# Patient Record
Sex: Female | Born: 1990 | Race: White | Hispanic: No | Marital: Married | Smoking: Never smoker
Health system: Southern US, Community
[De-identification: ages and names within clinical notes are randomized; demographics above are authoritative.]

## PROBLEM LIST (undated history)

## (undated) DIAGNOSIS — N200 Calculus of kidney: Secondary | ICD-10-CM

## (undated) HISTORY — PX: CHOLECYSTECTOMY: SHX55

---

## 2013-01-07 ENCOUNTER — Ambulatory Visit: Payer: Self-pay | Admitting: Family Medicine

## 2014-09-03 ENCOUNTER — Ambulatory Visit: Payer: Self-pay | Admitting: Family Medicine

## 2014-09-03 LAB — RAPID INFLUENZA A&B ANTIGENS

## 2014-09-03 LAB — RAPID STREP-A WITH REFLX: MICRO TEXT REPORT: NEGATIVE

## 2014-09-07 LAB — BETA STREP CULTURE(ARMC)

## 2014-12-26 ENCOUNTER — Ambulatory Visit: Payer: Self-pay | Admitting: Physician Assistant

## 2014-12-26 LAB — RAPID STREP-A WITH REFLX: MICRO TEXT REPORT: NEGATIVE

## 2014-12-28 LAB — BETA STREP CULTURE(ARMC)

## 2014-12-31 ENCOUNTER — Ambulatory Visit: Payer: Self-pay | Admitting: Physician Assistant

## 2014-12-31 LAB — CBC WITH DIFFERENTIAL/PLATELET
BASOS ABS: 0.1 10*3/uL (ref 0.0–0.1)
BASOS PCT: 0.5 %
EOS ABS: 0.1 10*3/uL (ref 0.0–0.7)
Eosinophil %: 0.7 %
HCT: 41 % (ref 35.0–47.0)
HGB: 13.8 g/dL (ref 12.0–16.0)
LYMPHS PCT: 29.7 %
Lymphocyte #: 3.9 10*3/uL — ABNORMAL HIGH (ref 1.0–3.6)
MCH: 28.1 pg (ref 26.0–34.0)
MCHC: 33.7 g/dL (ref 32.0–36.0)
MCV: 83 fL (ref 80–100)
Monocyte #: 0.5 x10 3/mm (ref 0.2–0.9)
Monocyte %: 3.6 %
NEUTROS PCT: 65.5 %
Neutrophil #: 8.6 10*3/uL — ABNORMAL HIGH (ref 1.4–6.5)
Platelet: 314 10*3/uL (ref 150–440)
RBC: 4.92 10*6/uL (ref 3.80–5.20)
RDW: 12.9 % (ref 11.5–14.5)
WBC: 13.2 10*3/uL — ABNORMAL HIGH (ref 3.6–11.0)

## 2014-12-31 LAB — URINALYSIS, COMPLETE
Bilirubin,UR: NEGATIVE
Blood: NEGATIVE
Glucose,UR: NEGATIVE
Ketone: 40
Leukocyte Esterase: NEGATIVE
NITRITE: NEGATIVE
PROTEIN: NEGATIVE
Ph: 5.5 (ref 5.0–8.0)
Specific Gravity: 1.025 (ref 1.000–1.030)

## 2014-12-31 LAB — MONONUCLEOSIS SCREEN: Mono Test: NEGATIVE

## 2015-01-01 ENCOUNTER — Emergency Department: Payer: Self-pay | Admitting: Emergency Medicine

## 2015-01-01 LAB — CBC
HCT: 41.8 % (ref 35.0–47.0)
HGB: 13.7 g/dL (ref 12.0–16.0)
MCH: 28.1 pg (ref 26.0–34.0)
MCHC: 32.9 g/dL (ref 32.0–36.0)
MCV: 85 fL (ref 80–100)
Platelet: 299 10*3/uL (ref 150–440)
RBC: 4.89 10*6/uL (ref 3.80–5.20)
RDW: 13.1 % (ref 11.5–14.5)
WBC: 9.8 10*3/uL (ref 3.6–11.0)

## 2015-01-01 LAB — COMPREHENSIVE METABOLIC PANEL
ALT: 49 U/L (ref 14–63)
ANION GAP: 8 (ref 7–16)
AST: 40 U/L — AB (ref 15–37)
Albumin: 4 g/dL (ref 3.4–5.0)
Alkaline Phosphatase: 77 U/L (ref 46–116)
BUN: 9 mg/dL (ref 7–18)
Bilirubin,Total: 0.4 mg/dL (ref 0.2–1.0)
CALCIUM: 9.7 mg/dL (ref 8.5–10.1)
CO2: 27 mmol/L (ref 21–32)
Chloride: 104 mmol/L (ref 98–107)
Creatinine: 0.87 mg/dL (ref 0.60–1.30)
EGFR (Non-African Amer.): >60
Glucose: 95 mg/dL (ref 65–99)
Osmolality: 276 (ref 275–301)
POTASSIUM: 3.9 mmol/L (ref 3.5–5.1)
Sodium: 139 mmol/L (ref 136–145)
Total Protein: 8.3 g/dL — ABNORMAL HIGH (ref 6.4–8.2)

## 2015-01-01 LAB — URINALYSIS, COMPLETE
BILIRUBIN, UR: NEGATIVE
Blood: NEGATIVE
Glucose,UR: NEGATIVE mg/dL (ref 0–75)
KETONE: NEGATIVE
LEUKOCYTE ESTERASE: NEGATIVE
NITRITE: NEGATIVE
PH: 7 (ref 4.5–8.0)
PROTEIN: NEGATIVE
RBC,UR: <1 /HPF (ref 0–5)
SPECIFIC GRAVITY: 1.009 (ref 1.003–1.030)
Squamous Epithelial: 1
WBC UR: 1 /HPF (ref 0–5)

## 2015-01-01 LAB — LIPASE, BLOOD: Lipase: 115 U/L (ref 73–393)

## 2015-01-01 LAB — HCG, QUANTITATIVE, PREGNANCY

## 2015-01-02 LAB — URINE CULTURE

## 2015-01-03 ENCOUNTER — Ambulatory Visit: Payer: Self-pay | Admitting: Family Medicine

## 2015-01-15 ENCOUNTER — Ambulatory Visit: Payer: Self-pay | Admitting: Family Medicine

## 2016-01-06 ENCOUNTER — Ambulatory Visit
Admission: EM | Admit: 2016-01-06 | Discharge: 2016-01-06 | Disposition: A | Discharge status: Home / Self Care | Payer: 59 | Attending: Family Medicine | Admitting: Family Medicine

## 2016-01-06 DIAGNOSIS — J019 Acute sinusitis, unspecified: Secondary | ICD-10-CM | POA: Diagnosis not present

## 2016-01-06 DIAGNOSIS — J069 Acute upper respiratory infection, unspecified: Secondary | ICD-10-CM | POA: Diagnosis not present

## 2016-01-06 HISTORY — DX: Calculus of kidney: N20.0

## 2016-01-06 MED ORDER — FEXOFENADINE-PSEUDOEPHED ER 180-240 MG PO TB24: 1.0000 | ORAL_TABLET | Freq: Every day | ORAL | Status: DC

## 2016-01-06 MED ORDER — PREDNISONE 10 MG (21) PO TBPK: ORAL_TABLET | ORAL | Status: DC

## 2016-01-06 MED ORDER — CEFUROXIME AXETIL 500 MG PO TABS: 500.0000 mg | ORAL_TABLET | Freq: Two times a day (BID) | ORAL | Status: DC

## 2016-01-06 NOTE — ED Notes (Signed)
Patient states that she has been experiencing heavy nasal congestion, body aches, headache, and occ. Cough which started this past Wednesday.

## 2016-01-06 NOTE — ED Provider Notes (Signed)
CSN: [id number]     Arrival date & time 01/06/16  4098 History   First MD Initiated Contact with Patient 01/06/16 1026    Nurses notes were reviewed. Chief Complaint  Patient presents with  . URI   Patient's here some nasal congestion cough. She reports facial pain pressure and myalgia she's had a mild sore throat from drainage and she's had a cough is nonproductive, nose is green take them occasionally some dark black specks seen most nose. States that it started last week Wednesday. No fever and myalgias continued as well. The nasal congestion seems to be getting worse She does not smoke. History of kidney stones the gallbladder surgery and her father has diabetes and hypertension.    (Consider location/radiation/quality/duration/timing/severity/associated sxs/prior Treatment) Patient is a 25 y.o. female presenting with URI. The history is provided by the patient. No language interpreter was used.  URI Presenting symptoms: congestion, cough, facial pain, rhinorrhea and sore throat   Presenting symptoms: no ear pain and no fever   Severity:  Moderate Duration:  1 week Progression:  Worsening Chronicity:  New Relieved by:  Nothing Associated symptoms: myalgias, sinus pain and swollen glands   Risk factors: recent illness   Risk factors: no chronic kidney disease, no chronic respiratory disease and no diabetes mellitus     Past Medical History  Diagnosis Date  . Kidney stones    Past Surgical History  Procedure Laterality Date  . Cholecystectomy     Family History  Problem Relation Age of Onset  . Diabetes Father   . Hypertension Father    Social History  Substance Use Topics  . Smoking status: Never Smoker   . Smokeless tobacco: None  . Alcohol Use: Yes     Comment: Socially   OB History    No data available     Review of Systems  Constitutional: Negative for fever.  HENT: Positive for congestion, rhinorrhea and sore throat. Negative for ear pain.   Respiratory:  Positive for cough.   Musculoskeletal: Positive for myalgias.  All other systems reviewed and are negative.   Allergies  Amoxicillin  Home Medications   Prior to Admission medications   Medication Sig Start Date End Date Taking? Authorizing Provider  norgestimate-ethinyl estradiol (ORTHO-CYCLEN,SPRINTEC,PREVIFEM) 0.25-35 MG-MCG tablet Take 1 tablet by mouth daily.   Yes Historical Provider, MD  cefUROXime (CEFTIN) 500 MG tablet Take 1 tablet (500 mg total) by mouth 2 (two) times daily. 01/06/16   Hassan Rowan, MD  fexofenadine-pseudoephedrine (ALLEGRA-D ALLERGY & CONGESTION) 180-240 MG 24 hr tablet Take 1 tablet by mouth daily. 01/06/16   Hassan Rowan, MD  predniSONE (STERAPRED UNI-PAK 21 TAB) 10 MG (21) TBPK tablet Sig 6 tablet day 1, 5 tablets day 2, 4 tablets day 3,,3tablets day 4, 2 tablets day 5, 1 tablet day 6 take all tablets orally 01/06/16   Hassan Rowan, MD   Meds Ordered and Administered this Visit  Medications - No data to display  BP 115/74 mmHg  Pulse 78  Temp(Src) 98.6 F (37 C) (Oral)  Ht  (1.6 m)  Wt 255 lb (115.667 kg)  BMI 45.18 kg/m2  SpO2 100%  LMP 12/19/2015 No data found.   Physical Exam  Constitutional: She is oriented to person, place, and time. She appears well-developed and well-nourished.  HENT:  Head: Normocephalic and atraumatic.  Right Ear: Hearing, tympanic membrane, external ear and ear canal normal.  Left Ear: Hearing, tympanic membrane and external ear normal.  Nose: Mucosal edema and  rhinorrhea present. Right sinus exhibits maxillary sinus tenderness and frontal sinus tenderness. Left sinus exhibits maxillary sinus tenderness and frontal sinus tenderness.  Mouth/Throat: She does not have dentures. Normal dentition. Posterior oropharyngeal erythema present.  Eyes: Conjunctivae are normal. Pupils are equal, round, and reactive to light.  Neck: Normal range of motion. Neck supple.  Cardiovascular: Normal rate and regular rhythm.    Pulmonary/Chest: Effort normal.  Musculoskeletal: Normal range of motion.  Lymphadenopathy:    She has cervical adenopathy.  Neurological: She is alert and oriented to person, place, and time.  Skin: Skin is warm.  Psychiatric: She has a normal mood and affect.  Vitals reviewed.   ED Course  Procedures (including critical care time)  Labs Review Labs Reviewed - No data to display  Imaging Review No results found.   Visual Acuity Review  Right Eye Distance:   Left Eye Distance:   Bilateral Distance:    Right Eye Near:   Left Eye Near:    Bilateral Near:         MDM   1. Acute sinusitis, recurrence not specified, unspecified location   2. URI, acute      We'll treat her for sinusitis. Allegra-D 1 tablet daily, Ceftin 500 mg 1 tablet twice a day, will place on a six-day course of prednisone taper dose.. Work note for today and tomorrow also be given and follow-up with PCP in 1-2 weeks.  Note: This dictation was prepared with Dragon dictation along with smaller phrase technology. Any transcriptional errors that result from this process are unintentional.     Hassan Rowan, MD 01/06/16 1116

## 2016-01-06 NOTE — Discharge Instructions (Signed)
Sinusitis, Adult °Sinusitis is redness, soreness, and puffiness (inflammation) of the air pockets in the bones of your face (sinuses). The redness, soreness, and puffiness can cause air and mucus to get trapped in your sinuses. This can allow germs to grow and cause an infection.  °HOME CARE  °· Drink enough fluids to keep your pee (urine) clear or pale yellow. °· Use a humidifier in your home. °· Run a hot shower to create steam in the bathroom. Sit in the bathroom with the door closed. Breathe in the steam 3-4 times a day. °· Put a warm, moist washcloth on your face 3-4 times a day, or as told by your doctor. °· Use salt water sprays (saline sprays) to wet the thick fluid in your nose. This can help the sinuses drain. °· Only take medicine as told by your doctor. °GET HELP RIGHT AWAY IF:  °· Your pain gets worse. °· You have very bad headaches. °· You are sick to your stomach (nauseous). °· You throw up (vomit). °· You are very sleepy (drowsy) all the time. °· Your face is puffy (swollen). °· Your vision changes. °· You have a stiff neck. °· You have trouble breathing. °MAKE SURE YOU:  °· Understand these instructions. °· Will watch your condition. °· Will get help right away if you are not doing well or get worse. °  °This information is not intended to replace advice given to you by your health care provider. Make sure you discuss any questions you have with your health care provider. °  °Document Released: 05/03/2008 Document Revised: 12/06/2014 Document Reviewed: 06/20/2012 °Elsevier Interactive Patient Education ©2016 [employer] ° °Upper Respiratory Infection, Adult °Most upper respiratory infections (URIs) are caused by a virus. A URI affects the nose, throat, and upper air passages. The most common type of URI is often called "the common cold." °HOME CARE  °· Take medicines only as told by your doctor. °· Gargle warm saltwater or take cough drops to comfort your throat as told by your doctor. °· Use a  warm mist humidifier or inhale steam from a shower to increase air moisture. This may make it easier to breathe. °· Drink enough fluid to keep your pee (urine) clear or pale yellow. °· Eat soups and other clear broths. °· Have a healthy diet. °· Rest as needed. °· Go back to work when your fever is gone or your doctor says it is okay. °¨ You may need to stay home longer to avoid giving your URI to others. °¨ You can also wear a face mask and wash your hands often to prevent spread of the virus. °· Use your inhaler more if you have asthma. °· Do not use any tobacco products, including cigarettes, chewing tobacco, or electronic cigarettes. If you need help quitting, ask your doctor. °GET HELP IF: °· You are getting worse, not better. °· Your symptoms are not helped by medicine. °· You have chills. °· You are getting more short of breath. °· You have brown or red mucus. °· You have yellow or brown discharge from your nose. °· You have pain in your face, especially when you bend forward. °· You have a fever. °· You have puffy (swollen) neck glands. °· You have pain while swallowing. °· You have white areas in the back of your throat. °GET HELP RIGHT AWAY IF:  °· You have very bad or constant: °¨ Headache. °¨ Ear pain. °¨ Pain in your forehead, behind your eyes, and over   your cheekbones (sinus pain). °¨ Chest pain. °· You have long-lasting (chronic) lung disease and any of the following: °¨ Wheezing. °¨ Long-lasting cough. °¨ Coughing up blood. °¨ A change in your usual mucus. °· You have a stiff neck. °· You have changes in your: °¨ Vision. °¨ Hearing. °¨ Thinking. °¨ Mood. °MAKE SURE YOU:  °· Understand these instructions. °· Will watch your condition. °· Will get help right away if you are not doing well or get worse. °  °This information is not intended to replace advice given to you by your health care provider. Make sure you discuss any questions you have with your health care provider. °  °Document Released:  05/03/2008 Document Revised: 04/01/2015 Document Reviewed: 02/20/2014 °Elsevier Interactive Patient Education ©2016 Elsevier Inc. ° °

## 2016-09-28 ENCOUNTER — Ambulatory Visit
Admission: EM | Admit: 2016-09-28 | Discharge: 2016-09-28 | Disposition: A | Discharge status: Home / Self Care | Payer: 59 | Attending: Family Medicine | Admitting: Family Medicine

## 2016-09-28 DIAGNOSIS — J209 Acute bronchitis, unspecified: Secondary | ICD-10-CM | POA: Diagnosis not present

## 2016-09-28 DIAGNOSIS — R059 Cough, unspecified: Secondary | ICD-10-CM

## 2016-09-28 DIAGNOSIS — J069 Acute upper respiratory infection, unspecified: Secondary | ICD-10-CM | POA: Diagnosis not present

## 2016-09-28 DIAGNOSIS — R05 Cough: Secondary | ICD-10-CM | POA: Diagnosis not present

## 2016-09-28 MED ORDER — AZITHROMYCIN 250 MG PO TABS: ORAL_TABLET | ORAL | 0 refills | Status: DC

## 2016-09-28 MED ORDER — HYDROCOD POLST-CPM POLST ER 10-8 MG/5ML PO SUER: 5.0000 mL | Freq: Two times a day (BID) | ORAL | 0 refills | Status: DC | PRN

## 2016-09-28 MED ORDER — FEXOFENADINE-PSEUDOEPHED ER 180-240 MG PO TB24: 1.0000 | ORAL_TABLET | Freq: Every day | ORAL | 0 refills | Status: DC

## 2016-09-28 NOTE — ED Provider Notes (Addendum)
[facility]    CSN: [id number] Arrival date & time: 09/28/16  1052     History   Chief Complaint Chief Complaint  Patient presents with  . Cough    HPI Kathryn Valdez is a 25 y.o. female.   She is here because of three-week cough. She states she will cough for about 3 weeks but the last 4 days she's had increased cough and productive sputum. She denies any wheezing she does not smoke no known drug allergies. She denies any significant past family medical history. Surgery is cholecystectomy in the recent past. She goes [employer].   The history is provided by the patient. No language interpreter was used.  URI  Presenting symptoms: cough, rhinorrhea and sore throat   Severity:  Mild Progression:  Waxing and waning Chronicity:  New Relieved by:  Nothing Worsened by:  Nothing Ineffective treatments:  None tried   Past Medical History:  Diagnosis Date  . Kidney stones     There are no active problems to display for this patient.   Past Surgical History:  Procedure Laterality Date  . CHOLECYSTECTOMY      OB History    No data available       Home Medications    Prior to Admission medications   Medication Sig Start Date End Date Taking? Authorizing Provider  fexofenadine-pseudoephedrine (ALLEGRA-D ALLERGY & CONGESTION) 180-240 MG 24 hr tablet Take 1 tablet by mouth daily. 01/06/16  Yes Hassan RowanEugene Duell Holdren, MD  norgestimate-ethinyl estradiol (ORTHO-CYCLEN,SPRINTEC,PREVIFEM) 0.25-35 MG-MCG tablet Take 1 tablet by mouth daily.   Yes Historical Provider, MD  azithromycin (ZITHROMAX Z-PAK) 250 MG tablet Take 2 tablets first day and then 1 po a day for 4 days 09/28/16   Hassan RowanEugene Branston Halsted, MD  chlorpheniramine-HYDROcodone [facility](TUSSIONEX PENNKINETIC ER) 10-8 MG/5ML SUER Take 5 mLs by mouth every 12 (twelve) hours as needed for cough. 09/28/16   Hassan RowanEugene Kristyne Woodring, MD  fexofenadine-pseudoephedrine (ALLEGRA-D ALLERGY & CONGESTION) 180-240 MG 24 hr tablet Take 1 tablet  by mouth daily. 09/28/16   Hassan RowanEugene Takiah Maiden, MD    Family History Family History  Problem Relation Age of Onset  . Diabetes Father   . Hypertension Father     Social History Social History  Substance Use Topics  . Smoking status: Never Smoker  . Smokeless tobacco: Never Used  . Alcohol use Yes     Comment: Socially     Allergies   Amoxicillin   Review of Systems Review of Systems  HENT: Positive for rhinorrhea and sore throat.   Respiratory: Positive for cough and shortness of breath.   All other systems reviewed and are negative.    Physical Exam Triage Vital Signs ED Triage Vitals  Enc Vitals Group     BP 09/28/16 1305 (!) 127/94     Pulse Rate 09/28/16 1305 87     Resp 09/28/16 1305 18     Temp 09/28/16 1305 98.2 F (36.8 C)     Temp Source 09/28/16 1305 Oral     SpO2 09/28/16 1305 100 %     Weight 09/28/16 1305 250 lb (113.4 kg)     Height 09/28/16 1305 5\' 3"  (1.6 m)     Head Circumference --      Peak Flow --      Pain Score 09/28/16 1307 4     Pain Loc --      Pain Edu? --      Excl. in GC? --    No data  found.   Updated Vital Signs BP (!) 127/94 (BP Location: Left Arm)   Pulse 87   Temp 98.2 F (36.8 C) (Oral)   Resp 18   Ht 5\' 3"  (1.6 m)   Wt 250 lb (113.4 kg)   LMP 09/07/2016   SpO2 100%   BMI 44.29 kg/m   Visual Acuity Right Eye Distance:   Left Eye Distance:   Bilateral Distance:    Right Eye Near:   Left Eye Near:    Bilateral Near:     Physical Exam  Constitutional: She is oriented to person, place, and time. She appears well-developed and well-nourished.  HENT:  Head: Normocephalic and atraumatic.  Right Ear: Hearing, tympanic membrane, external ear and ear canal normal.  Left Ear: Hearing, tympanic membrane, external ear and ear canal normal.  Nose: Nose normal. No mucosal edema or rhinorrhea. Right sinus exhibits no maxillary sinus tenderness and no frontal sinus tenderness. Left sinus exhibits no maxillary sinus tenderness  and no frontal sinus tenderness.  Mouth/Throat: Uvula is midline, oropharynx is clear and moist and mucous membranes are normal. No posterior oropharyngeal erythema.  Eyes: Pupils are equal, round, and reactive to light.  Neck: Normal range of motion. Neck supple.  Cardiovascular: Normal rate and regular rhythm.   Pulmonary/Chest: Effort normal and breath sounds normal.  Musculoskeletal: Normal range of motion.  Lymphadenopathy:    She has cervical adenopathy.  Neurological: She is alert and oriented to person, place, and time.  Skin: Skin is warm.  Psychiatric: She has a normal mood and affect.  Vitals reviewed.    UC Treatments / Results  Labs (all labs ordered are listed, but only abnormal results are displayed) Labs Reviewed - No data to display  EKG  EKG Interpretation None       Radiology No results found.  Procedures Procedures (including critical care time)  Medications Ordered in UC Medications - No data to display   Initial Impression / Assessment and Plan / UC Course  I have reviewed the triage vital signs and the nursing notes.  Pertinent labs & imaging results that were available during my care of the patient were reviewed by me and considered in my medical decision making (see chart for details).  Clinical Course  We'll treat for bronchitis with Allegra-D. Tussionex 1 teaspoon once twice a day warned about driving with the Tussionex and a Z-Pak. for school for today and tomorrow as well. pcp in 2 weeks not better  Final Clinical Impressions(s) / UC Diagnoses   Final diagnoses:  Cough  Upper respiratory tract infection, unspecified type  Acute bronchitis, unspecified organism    New Prescriptions New Prescriptions   AZITHROMYCIN (ZITHROMAX Z-PAK) 250 MG TABLET    Take 2 tablets first day and then 1 po a day for 4 days   CHLORPHENIRAMINE-HYDROCODONE (TUSSIONEX PENNKINETIC ER) 10-8 MG/5ML SUER    Take 5 mLs by mouth every 12 (twelve) hours as needed  for cough.   FEXOFENADINE-PSEUDOEPHEDRINE (ALLEGRA-D ALLERGY & CONGESTION) 180-240 MG 24 HR TABLET    Take 1 tablet by mouth daily.     Note: This dictation was prepared with Dragon dictation along with smaller phrase technology. Any transcriptional errors that result from this process are unintentional.   Hassan RowanEugene Annalysse Shoemaker, MD 09/28/16 1342    Hassan RowanEugene Leyanna Bittman, MD 09/28/16 2043

## 2016-09-28 NOTE — ED Triage Notes (Signed)
Pt c/o cough that she cant get rid of. She had a cold in early October and the cough will not go away and its painful in her chest when she coughs it feels tight.

## 2016-11-11 ENCOUNTER — Ambulatory Visit (INDEPENDENT_AMBULATORY_CARE_PROVIDER_SITE_OTHER): Payer: 59

## 2016-11-11 ENCOUNTER — Ambulatory Visit
Admission: EM | Admit: 2016-11-11 | Discharge: 2016-11-11 | Disposition: A | Discharge status: Home / Self Care | Payer: 59 | Attending: Emergency Medicine | Admitting: Emergency Medicine

## 2016-11-11 DIAGNOSIS — R0600 Dyspnea, unspecified: Secondary | ICD-10-CM

## 2016-11-11 DIAGNOSIS — R05 Cough: Secondary | ICD-10-CM

## 2016-11-11 DIAGNOSIS — R0602 Shortness of breath: Secondary | ICD-10-CM | POA: Diagnosis present

## 2016-11-11 DIAGNOSIS — I498 Other specified cardiac arrhythmias: Secondary | ICD-10-CM | POA: Insufficient documentation

## 2016-11-11 DIAGNOSIS — R059 Cough, unspecified: Secondary | ICD-10-CM

## 2016-11-11 MED ORDER — IPRATROPIUM-ALBUTEROL 0.5-2.5 (3) MG/3ML IN SOLN
3.0000 mL | Freq: Once | RESPIRATORY_TRACT | Status: AC
Start: 2016-11-11 — End: 2016-11-11
  Administered 2016-11-11: 3 mL via RESPIRATORY_TRACT

## 2016-11-11 MED ORDER — PREDNISONE 10 MG (21) PO TBPK: ORAL_TABLET | ORAL | 0 refills | Status: DC

## 2016-11-11 MED ORDER — ALBUTEROL SULFATE HFA 108 (90 BASE) MCG/ACT IN AERS: 1.0000 | INHALATION_SPRAY | Freq: Four times a day (QID) | RESPIRATORY_TRACT | 0 refills | Status: DC | PRN

## 2016-11-11 MED ORDER — AEROCHAMBER PLUS MISC: 2 refills | Status: DC

## 2016-11-11 NOTE — ED Triage Notes (Signed)
Patient complains of cough that she has had since the beginning of October. Patient was seen in here on October 31st and treated with Azithromycin. Patient states that she has been having shortness of breath and has been very fatigued. Patient states that she was unable to get any relief from previous medications.

## 2016-11-11 NOTE — ED Provider Notes (Signed)
HPI  SUBJECTIVE:  Kathryn Valdez is a 25 y.o. female who presents with a cough for the past 10 weeks. She states that she had an upper respiratory infection at first, and that the cough never cleared up. She reports diffuse, daily, constant chest tightness for the past several weeks with no exertional positional component, shortness of breath at rest and shortness of breath going up one flight of stairs. She reports occasional PND. She   has tried azithromycin, Allegra-D, DayQuil, NyQuil, Mucinex, and a prescription cough syrup without improvement in her symptoms. There are no aggravating factors. No nasal congestion, rhinorrhea, postnasal drip, fevers, wheezing, pleuritic chest pain. No hemoptysis, unintentional weight loss, night sweats, TB exposure, travel. No unintentional weight gain, lower extremity edema, orthopnea, nocturia, abdominal pain. She reports GERD symptoms, but states that this has been unchanged over the past 10 years. She is on OCPs only. She is not on any Ace inhibitors. She is a past medical history of GERD. No history of asthma, emphysema, COPD, HIV, cancer, diabetes, hypertension, PE, DVT, hypercoagulability, congestive heart failure, cardiomyopathy. LMP: 11/28. Denies possibility of being pregnant. PM[facility]   She was seen at this facility on 10/31 for cough. She was thought to have a URI/bronchitis and sent home on azithromycin, Allegra-D, Tussionex.   Past Medical History:  Diagnosis Date  . Kidney stones     Past Surgical History:  Procedure Laterality Date  . CHOLECYSTECTOMY      Family History  Problem Relation Age of Onset  . Diabetes Father   . Hypertension Father     Social History  Substance Use Topics  . Smoking status: Never Smoker  . Smokeless tobacco: Never Used  . Alcohol use Yes     Comment: Socially    No current facility-administered medications for this encounter.   Current Outpatient Prescriptions:  .   norgestimate-ethinyl estradiol (ORTHO-CYCLEN,SPRINTEC,PREVIFEM) 0.25-35 MG-MCG tablet, Take 1 tablet by mouth daily., Disp: , Rfl:  .  albuterol (PROVENTIL HFA;VENTOLIN HFA) 108 (90 Base) MCG/ACT inhaler, Inhale 1-2 puffs into the lungs every 6 (six) hours as needed for wheezing or shortness of breath., Disp: 1 Inhaler, Rfl: 0 .  chlorpheniramine-HYDROcodone (TUSSIONEX PENNKINETIC ER) 10-8 MG/5ML SUER, Take 5 mLs by mouth every 12 (twelve) hours as needed for cough., Disp: 115 mL, Rfl: 0 .  fexofenadine-pseudoephedrine (ALLEGRA-D ALLERGY & CONGESTION) 180-240 MG 24 hr tablet, Take 1 tablet by mouth daily., Disp: 30 tablet, Rfl: 0 .  fexofenadine-pseudoephedrine (ALLEGRA-D ALLERGY & CONGESTION) 180-240 MG 24 hr tablet, Take 1 tablet by mouth daily., Disp: 30 tablet, Rfl: 0 .  predniSONE (STERAPRED UNI-PAK 21 TAB) 10 MG (21) TBPK tablet, Dispense one 6 day pack. Take as directed with food., Disp: 21 tablet, Rfl: 0 .  Spacer/Aero-Holding Chambers (AEROCHAMBER PLUS) inhaler, Use as instructed, Disp: 1 each, Rfl: 2  Allergies  Allergen Reactions  . Amoxicillin Hives     ROS  As noted in HPI.   Physical Exam  BP 112/78 (BP Location: Left Arm)   Pulse 90   Temp 98.3 F (36.8 C) (Oral)   Resp 17   Ht 5\' 3"  (1.6 m)   Wt 250 lb (113.4 kg)   LMP 10/26/2016 Comment: denies preg  SpO2 100%   BMI 44.29 kg/m   Constitutional: Well developed, well nourished, no acute distress Eyes:  EOMI, conjunctiva normal bilaterally HENT: Normocephalic, atraumatic,mucus membranes moistHead no nasal congestion. Normal turbinates. Positive cobblestoning oropharynx. No appreciable postnasal drip Respiratory:  Normal inspiratory effort, lungs clear bilaterally. Positive diffuse chest wall tenderness Cardiovascular: Normal rate regular rhythm no murmurs rubs or gallops. No JVD. GI: nondistended, nontender. No hepatomegaly. skin: No rash, skin intact Musculoskeletal: Calves symmetric, nontender, no edema. no  deformities Neurologic: Alert & oriented x 3, no focal neuro deficits Psychiatric: Speech and behavior appropriate   ED Course   Medications  ipratropium-albuterol (DUONEB) 0.5-2.5 (3) MG/3ML nebulizer solution 3 mL (3 mLs Nebulization Given 11/11/16 1351)    Orders Placed This Encounter  Procedures  . DG Chest 2 View    Standing Status:   Standing    Number of Occurrences:   1    Order Specific Question:   Reason for Exam (SYMPTOM  OR DIAGNOSIS REQUIRED)    Answer:   cough x 2 months  . ED EKG    Standing Status:   Standing    Number of Occurrences:   1    Order Specific Question:   Reason for Exam    Answer:   Shortness of breath  . EKG 12-Lead    Standing Status:   Standing    Number of Occurrences:   1    No results found for this or any previous visit (from the past 24 hour(s)). Dg Chest 2 View  Result Date: 11/11/2016 CLINICAL DATA:  Productive cough with fatigue for 2 months, shortness of breath for 3-4 weeks worse in past 2 days EXAM: CHEST  2 VIEW COMPARISON:  12/31/2014 FINDINGS: Normal heart size, mediastinal contours, and pulmonary vascularity. Lungs clear. No pleural effusion or pneumothorax. Bones unremarkable. IMPRESSION: No acute abnormalities. Electronically Signed   By: Ulyses SouthwardMark  Boles M.D.   On: 11/11/2016 13:16    ED Clinical Impression  Cough  Dyspnea, unspecified type  ED Assessment/Plan  Previous records reviewed. As noted in history of present illness.  Chest x-ray independently reviewed. No pleural effusion, pneumothorax, lungs clear. See radiology report for details.  EKG: Sinus arrhythmia, rate 82. Normal axis, normal intervals. No hypertrophy. No ST-T wave changes. No previous EKG for comparison.  Will Give DuoNeb .  normal chest x-ray, normal EKG.   Per staff, patient states she feels significantly better post-DuoNeb.  No evidence of CHF, pulmonary mass. Not on any medicines that would cause cough. In the differential is PE, but would  expect tachycardia and hypoxia for clinically significant PE. Also the differential is acid reflux which the patient does have. However suspect occult bronchospasm. We'll send home on albuterol with spacer, prednisone taper. She will follow-[facility] in several days if not getting any better, for reevaluation and possible referral to pulmonologist.  Discussed  imaging, MDM, plan and followup with patient. Discussed sn/sx that should prompt return to the ED. Patient agrees with plan.   Meds ordered this encounter  Medications  . ipratropium-albuterol (DUONEB) 0.5-2.5 (3) MG/3ML nebulizer solution 3 mL  . albuterol (PROVENTIL HFA;VENTOLIN HFA) 108 (90 Base) MCG/ACT inhaler    Sig: Inhale 1-2 puffs into the lungs every 6 (six) hours as needed for wheezing or shortness of breath.    Dispense:  1 Inhaler    Refill:  0  . Spacer/Aero-Holding Chambers (AEROCHAMBER PLUS) inhaler    Sig: Use as instructed    Dispense:  1 each    Refill:  2  . predniSONE (STERAPRED UNI-PAK 21 TAB) 10 MG (21) TBPK tablet    Sig: Dispense one 6 day pack. Take as directed with food.  Dispense:  21 tablet    Refill:  0    *This clinic note was created using Scientist, clinical (histocompatibility and immunogenetics). Therefore, there may be occasional mistakes despite careful proofreading.  ?   Domenick Gong, MD 11/11/16 1436

## 2018-04-01 ENCOUNTER — Other Ambulatory Visit: Payer: Self-pay

## 2018-04-01 ENCOUNTER — Ambulatory Visit
Admission: EM | Admit: 2018-04-01 | Discharge: 2018-04-01 | Disposition: A | Discharge status: Home / Self Care | Payer: 59 | Attending: Family Medicine | Admitting: Family Medicine

## 2018-04-01 ENCOUNTER — Ambulatory Visit (INDEPENDENT_AMBULATORY_CARE_PROVIDER_SITE_OTHER): Payer: 59

## 2018-04-01 ENCOUNTER — Encounter: Payer: Self-pay | Admitting: Emergency Medicine

## 2018-04-01 DIAGNOSIS — M25561 Pain in right knee: Secondary | ICD-10-CM | POA: Diagnosis not present

## 2018-04-01 MED ORDER — MELOXICAM 15 MG PO TABS: 15.0000 mg | ORAL_TABLET | Freq: Every day | ORAL | 0 refills | Status: DC | PRN

## 2018-04-01 NOTE — ED Provider Notes (Signed)
[facility] ____________________________________________  Time seen: Approximately 1130 AM  I have reviewed the triage vital signs and the nursing notes.   HISTORY  Chief Complaint Knee Pain   HPI Kathryn Valdez is a 27 y.o. female presented for evaluation of right lateral knee pain post 2 different incidences that occurred this week.  Patient reports this past Wednesday she was standing with her feet planted and her dog pulled towards her right lateral side, causing her to twist her right knee.  States that she had mild pain to that lateral area of her knee since, and reports it is starting to improve.  However reports last night as she was getting into her car she had put her right leg and and as adjusting in the car there was a loud pop with increased pain to the same area of the right knee.  Denies pain radiation, direct fall or direct trauma.  Reports still has full range of motion present but with some pain.  States does hear intermittent clicking popping sensation.  Denies any giving out.  No skin changes.  Has tried some over-the-counter ibuprofen without much change.  Denies history of issues to the right knee in the past.  Denies other pain to the leg.  Reports otherwise feels well. Denies recent sickness. Denies recent antibiotic use.   Patient's last menstrual period was 03/18/2018 (approximate).Denies pregnancy,  Past Medical History:  Diagnosis Date  . Kidney stones     There are no active problems to display for this patient.   Past Surgical History:  Procedure Laterality Date  . CHOLECYSTECTOMY       No current facility-administered medications for this encounter.   Current Outpatient Medications:  .  norgestimate-ethinyl estradiol (ORTHO-CYCLEN,SPRINTEC,PREVIFEM) 0.25-35 MG-MCG tablet, Take 1 tablet by mouth daily., Disp: , Rfl:  .  albuterol (PROVENTIL HFA;VENTOLIN HFA) 108 (90 Base) MCG/ACT inhaler, Inhale 1-2 puffs into the lungs every 6  (six) hours as needed for wheezing or shortness of breath., Disp: 1 Inhaler, Rfl: 0 .  meloxicam (MOBIC) 15 MG tablet, Take 1 tablet (15 mg total) by mouth daily as needed., Disp: 10 tablet, Rfl: 0 .  Spacer/Aero-Holding Chambers (AEROCHAMBER PLUS) inhaler, Use as instructed, Disp: 1 each, Rfl: 2  Allergies Amoxicillin  Family History  Problem Relation Age of Onset  . Diabetes Father   . Hypertension Father     Social History Social History   Tobacco Use  . Smoking status: Never Smoker  . Smokeless tobacco: Never Used  Substance Use Topics  . Alcohol use: Yes    Comment: Socially  . Drug use: No    Review of Systems Constitutional: No fever/chills Cardiovascular: Denies chest pain. Respiratory: Denies shortness of breath. Gastrointestinal: No abdominal pain.   Musculoskeletal: Negative for back pain. As above.  Skin: Negative for rash.   ____________________________________________   PHYSICAL EXAM:  VITAL SIGNS: ED Triage Vitals  Enc Vitals Group     BP 04/01/18 1046 (!) 123/94     Pulse Rate 04/01/18 1046 66     Resp 04/01/18 1046 16     Temp 04/01/18 1046 98.2 F (36.8 C)     Temp Source 04/01/18 1046 Oral     SpO2 04/01/18 1046 100 %     Weight 04/01/18 1044 285 lb (129.3 kg)     Height 04/01/18 1044  (1.575 m)     Head Circumference --      Peak Flow --      Pain  Score 04/01/18 1044 7     Pain Loc --      Pain Edu? --      Excl. in GC? --     Constitutional: Alert and oriented. Well appearing and in no acute distress. Cardiovascular: Normal rate, regular rhythm. Grossly normal heart sounds.  Good peripheral circulation. Respiratory: Normal respiratory effort without tachypnea nor retractions. Breath sounds are clear and equal bilaterally. No wheezes, rales, rhonchi. Musculoskeletal:Bilateral pedal pulses equal and easily palpated.  Ambulatory with minimal antalgic gait.   Except: Right lateral knee mild diffuse tenderness to palpation, as well  as anterior lateral knee at the base of lateral patella mild to moderate tenderness to direct palpation, no clicking or popping elicited, full range of motion present, no pain with resisted knee extension, mild pain with resisted knee flexion, no pain with anterior posterior stress, mild pain with medial and lateral stress, no clear effusion present. Neurologic:  Normal speech and language.Speech is normal. No gait instability.  Skin:  Skin is warm, dry and intact. No rash noted. Psychiatric: Mood and affect are normal. Speech and behavior are normal. Patient exhibits appropriate insight and judgment   ___________________________________________   LABS (all labs ordered are listed, but only abnormal results are displayed)  Labs Reviewed - No data to display ____________________________________________  RADIOLOGY  Dg Knee Complete 4 Views Right  Result Date: 04/01/2018 CLINICAL DATA:  Twisted right knee on Wednesday with re-injury last night. EXAM: RIGHT KNEE - COMPLETE 4+ VIEW COMPARISON:  None. FINDINGS: No evidence of fracture, dislocation, or joint effusion. No evidence of arthropathy or other focal bone abnormality. Soft tissues are unremarkable. IMPRESSION: Negative. Electronically Signed   By: Ted Mcalpine M.D.   On: 04/01/2018 11:51   ____________________________________________   PROCEDURES Procedures    INITIAL IMPRESSION / ASSESSMENT AND PLAN / ED COURSE  Pertinent labs & imaging results that were available during my care of the patient were reviewed by me and considered in my medical decision making (see chart for details).  Well-appearing patient.  No acute distress.  2 separate knee injuries this week, no direct trauma.  Suspect ligamentous or meniscal injury.  Right knee x-ray results as above, negative per radiologist.  Right knee immobilizer given and instructed to rest, ice and elevate.  Oral daily Mobic.  Supportive care and reevaluate middle of next week and  follow-up with orthopedic for continued pain due to ligamentous or meniscal injury concerns. Discussed indication, risks and benefits of medications with patient.   Discussed follow up and return parameters including no resolution or any worsening concerns. Patient verbalized understanding and agreed to plan.   ____________________________________________   FINAL CLINICAL IMPRESSION(S) / ED DIAGNOSES  Final diagnoses:  Acute pain of right knee     ED Discharge Orders        Ordered    meloxicam (MOBIC) 15 MG tablet  Daily PRN     04/01/18 1202       Note: This dictation was prepared with Dragon dictation along with smaller phrase technology. Any transcriptional errors that result from this process are unintentional.         Renford Dills, NP 04/01/18 1545

## 2018-04-01 NOTE — ED Triage Notes (Signed)
Patient states that she twisted her right knee 2 days ago while walking her dog.

## 2018-04-01 NOTE — Discharge Instructions (Signed)
Take medication as prescribed. Rest. Drink plenty of fluids. Ice. Elevate.  Gradually increase activity as tolerated.  Follow-up with orthopedic this week as needed for continued pain, as discussed.  Follow up with your primary care physician this week as needed. Return to Urgent care for new or worsening concerns.

## 2018-09-06 ENCOUNTER — Other Ambulatory Visit: Payer: Self-pay

## 2018-09-06 ENCOUNTER — Ambulatory Visit
Admission: EM | Admit: 2018-09-06 | Discharge: 2018-09-06 | Disposition: A | Discharge status: Home / Self Care | Payer: 59 | Attending: Family Medicine | Admitting: Family Medicine

## 2018-09-06 ENCOUNTER — Encounter: Payer: Self-pay | Admitting: Emergency Medicine

## 2018-09-06 DIAGNOSIS — R05 Cough: Secondary | ICD-10-CM

## 2018-09-06 DIAGNOSIS — J0100 Acute maxillary sinusitis, unspecified: Secondary | ICD-10-CM

## 2018-09-06 DIAGNOSIS — R059 Cough, unspecified: Secondary | ICD-10-CM

## 2018-09-06 DIAGNOSIS — H65191 Other acute nonsuppurative otitis media, right ear: Secondary | ICD-10-CM

## 2018-09-06 MED ORDER — HYDROCOD POLST-CPM POLST ER 10-8 MG/5ML PO SUER: 5.0000 mL | Freq: Every evening | ORAL | 0 refills | Status: AC | PRN

## 2018-09-06 MED ORDER — DOXYCYCLINE HYCLATE 100 MG PO CAPS: 100.0000 mg | ORAL_CAPSULE | Freq: Two times a day (BID) | ORAL | 0 refills | Status: AC

## 2018-09-06 NOTE — Discharge Instructions (Addendum)
Take medication as prescribed. Rest. Drink plenty of fluids.  ° °Follow up with your primary care physician this week as needed. Return to Urgent care for new or worsening concerns.  ° °

## 2018-09-06 NOTE — ED Triage Notes (Signed)
Pt c/o sinus and chest congestion, right ear pain, cough, headache, sinus pain and pressure. No fever. Started about a week ago as allergy symptoms and got worse after flying.

## 2018-09-06 NOTE — ED Provider Notes (Signed)
[facility] ____________________________________________  Time seen: Approximately 1:04 PM  I have reviewed the triage vital signs and the nursing notes.   HISTORY  Chief Complaint Otalgia and Cough   HPI Kathryn Valdez is a 27 y.o. female presenting for evaluation of 10 days of runny nose, nasal congestion, sinus pressure with increased cough for the last few days.  Also reports accompanying bilateral mild ear discomfort, right greater than left.  Reports right ear with muffled hearing and mild to moderate pain.  States cough for the last few days has been disrupting sleep and is intermittently productive of whitish-greenish sputum.  Denies hemoptysis.  States continues to get thick nasal drainage out as well as a sinus pressure.  Denies known fevers.  Reports does have seasonal allergies and thought that this was her allergies initially.  States right ear discomfort increased after a recent flight.  No international travel.  States only time she feels any breathing changes this with multiple back to back coughing.  Denies chest pain or shortness of breath.  Unresolved with over-the-counter cough and congestion combination agents.  Denies current pregnancy.  Denies other aggravating alleviating factors.  Reports otherwise feels well.  Denies recent sickness or recent antibiotic use.    Past Medical History:  Diagnosis Date  . Kidney stones     There are no active problems to display for this patient.   Past Surgical History:  Procedure Laterality Date  . CHOLECYSTECTOMY       No current facility-administered medications for this encounter.   Current Outpatient Medications:  .  norgestimate-ethinyl estradiol (ORTHO-CYCLEN,SPRINTEC,PREVIFEM) 0.25-35 MG-MCG tablet, Take 1 tablet by mouth daily., Disp: , Rfl:  .  chlorpheniramine-HYDROcodone (TUSSIONEX PENNKINETIC ER) 10-8 MG/5ML SUER, Take 5 mLs by mouth at bedtime as needed for cough. do not drive or operate  machinery while taking as can cause drowsiness., Disp: 50 mL, Rfl: 0 .  doxycycline (VIBRAMYCIN) 100 MG capsule, Take 1 capsule (100 mg total) by mouth 2 (two) times daily., Disp: 20 capsule, Rfl: 0  Allergies Amoxicillin  Family History  Problem Relation Age of Onset  . Diabetes Father   . Hypertension Father     Social History Social History   Tobacco Use  . Smoking status: Never Smoker  . Smokeless tobacco: Never Used  Substance Use Topics  . Alcohol use: Yes    Comment: Socially  . Drug use: No    Review of Systems Constitutional: No fever ENT: Some sore throat.  Cardiovascular: Denies chest pain. Respiratory: Denies shortness of breath. Gastrointestinal: No abdominal pain.  Musculoskeletal: Negative for back pain. Skin: Negative for rash.   ____________________________________________   PHYSICAL EXAM:  VITAL SIGNS: ED Triage Vitals  Enc Vitals Group     BP 09/06/18 1231 106/80     Pulse Rate 09/06/18 1231 (!) 105     Resp 09/06/18 1231 18     Temp 09/06/18 1231 98.5 F (36.9 C)     Temp Source 09/06/18 1231 Oral     SpO2 09/06/18 1231 100 %     Weight 09/06/18 1229 280 lb (127 kg)     Height 09/06/18 1229 5\' 3"  (1.6 m)     Head Circumference --      Peak Flow --      Pain Score 09/06/18 1228 5     Pain Loc --      Pain Edu? --      Excl. in GC? --     Constitutional: Alert  and oriented. Well appearing and in no acute distress. Eyes: Conjunctivae are normal. PERRL. EOMI. Head: Atraumatic.Mild to moderate tenderness to palpation bilateral frontal and maxillary sinuses, maxillary worse than frontal. No swelling. No erythema.   Ears: Left: Nontender, normal canal, no erythema, normal TM.  Right: Nontender, normal canal, minimal erythema, effusion present, otherwise normal TM.  No mastoid tenderness bilaterally.  Nose: nasal congestion with bilateral nasal turbinate erythema and edema.   Mouth/Throat: Mucous membranes are moist.  Oropharynx  non-erythematous.No tonsillar swelling or exudate.  Neck: No stridor.  No cervical spine tenderness to palpation. Hematological/Lymphatic/Immunilogical: No cervical lymphadenopathy. Cardiovascular: Normal rate, regular rhythm. Grossly normal heart sounds.  Good peripheral circulation. Respiratory: Normal respiratory effort.  No retractions. No wheezes, rales or rhonchi. Good air movement.  Dry intermittent cough in room. Musculoskeletal: Steady gait.  Bilateral lower extremities no edema noted. Neurologic:  Normal speech and language. No gait instability. Skin:  Skin is warm, dry and intact. No rash noted. Psychiatric: Mood and affect are normal. Speech and behavior are normal.  ___________________________________________   LABS (all labs ordered are listed, but only abnormal results are displayed)  Labs Reviewed - No data to display   PROCEDURES Procedures   INITIAL IMPRESSION / ASSESSMENT AND PLAN / ED COURSE  Pertinent labs & imaging results that were available during my care of the patient were reviewed by me and considered in my medical decision making (see chart for details).  Well-appearing patient.  No acute distress.  Suspect recent upper respiratory infection versus allergic rhinitis with secondary sinusitis and postnasal drainage cough.  Right ear effusion also noted.  Encourage over-the-counter decongestants, rest, fluids, supportive care.  Will treat with oral doxycycline and PRN nightly Tussionex.  States that she will take over-the-counter cough medicine during the day as needed.Discussed indication, risks and benefits of medications with patient.  Discussed follow up with Primary care physician this week as needed. Discussed follow up and return parameters including no resolution or any worsening concerns. Patient verbalized understanding and agreed to plan.   ____________________________________________   FINAL CLINICAL IMPRESSION(S) / ED DIAGNOSES  Final  diagnoses:  Acute maxillary sinusitis, recurrence not specified  Acute effusion of right ear  Cough     ED Discharge Orders         Ordered    chlorpheniramine-HYDROcodone (TUSSIONEX PENNKINETIC ER) 10-8 MG/5ML SUER  At bedtime PRN     09/06/18 1252    doxycycline (VIBRAMYCIN) 100 MG capsule  2 times daily     09/06/18 1252           Note: This dictation was prepared with Dragon dictation along with smaller phrase technology. Any transcriptional errors that result from this process are unintentional.         Renford Dills, NP 09/06/18 1309

## 2019-06-27 IMAGING — CR DG KNEE COMPLETE 4+V*R*
4 series · 4 of 4 positions shown · non-contrast
Comparison: None.

CLINICAL DATA: Twisted right knee on [REDACTED] with re-injury last
night.

EXAM:
RIGHT KNEE - COMPLETE 4+ VIEW

[knee ap]
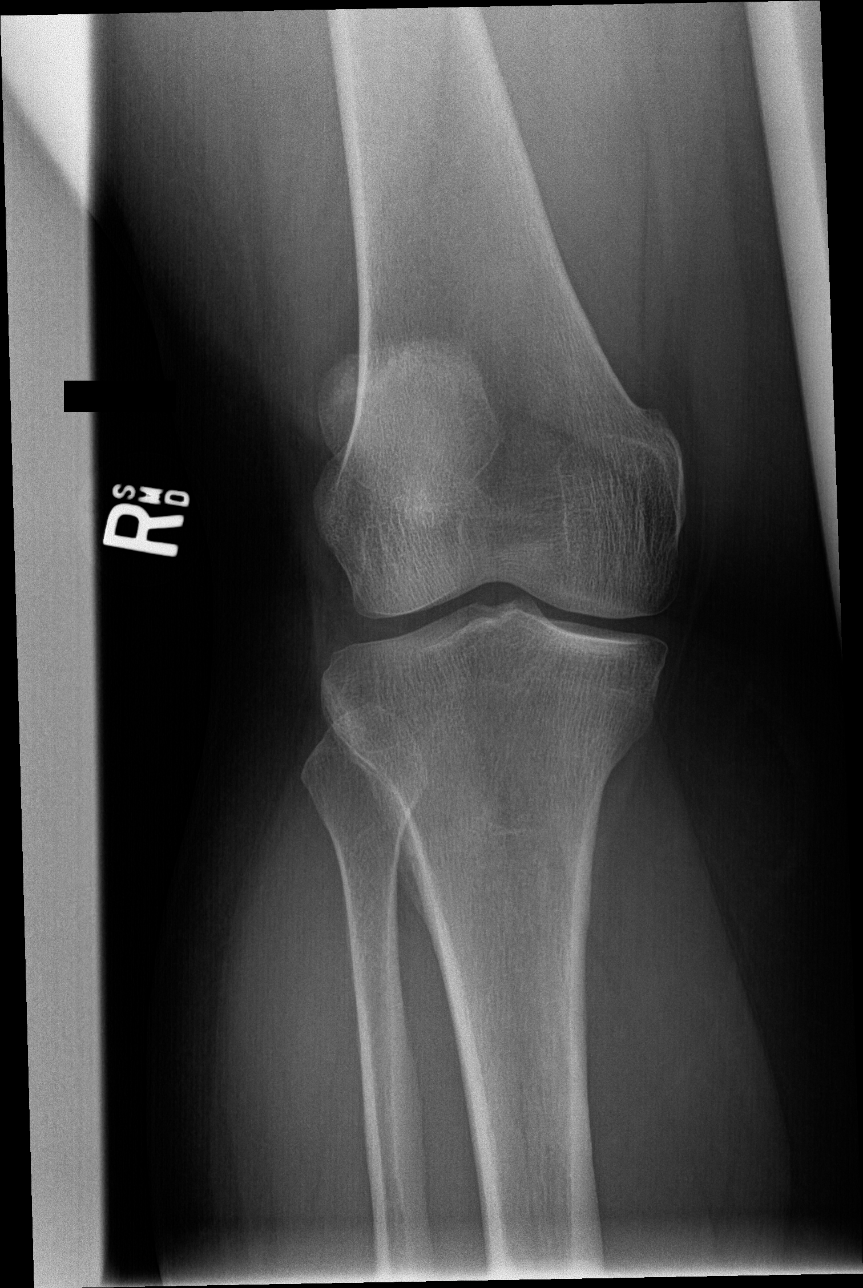

[knee lat]
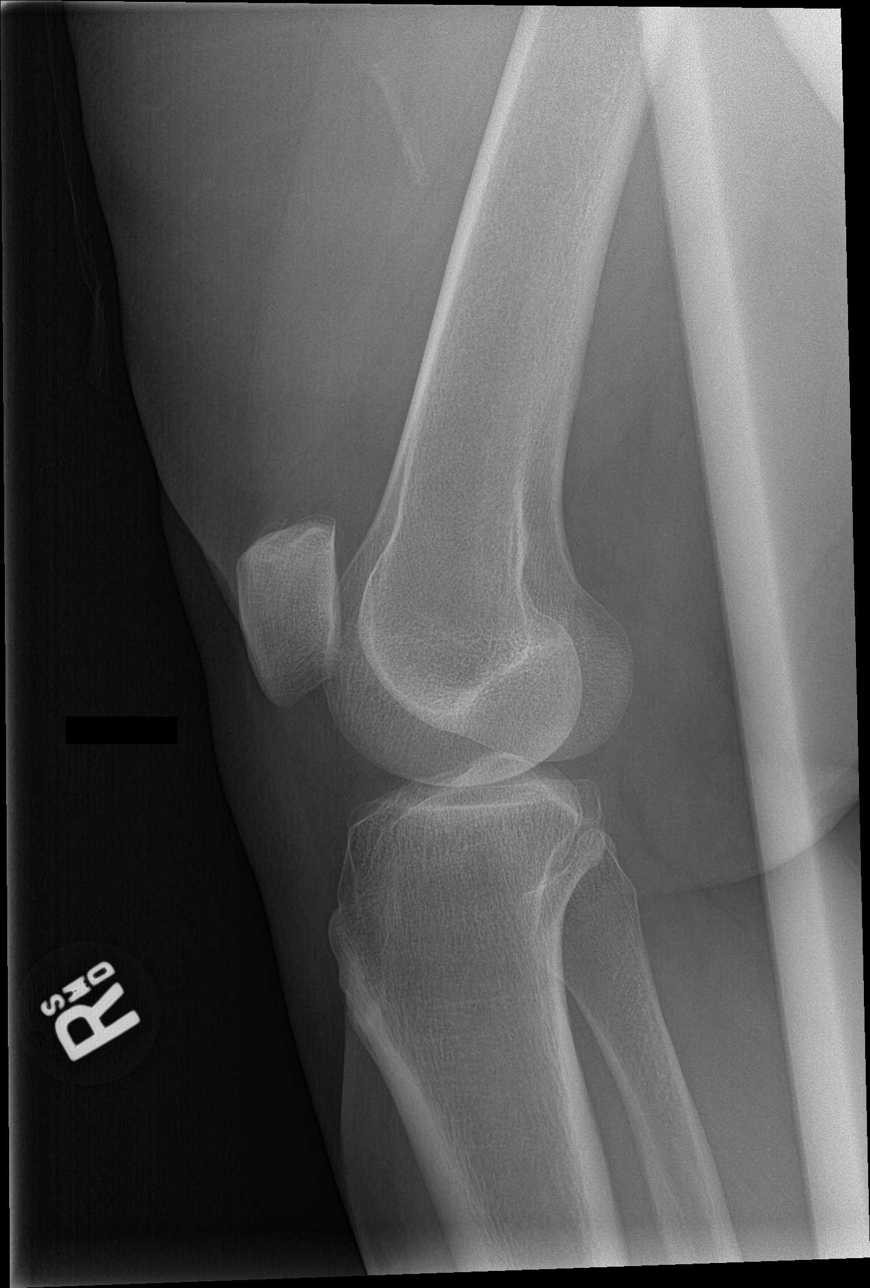

[tunnel]
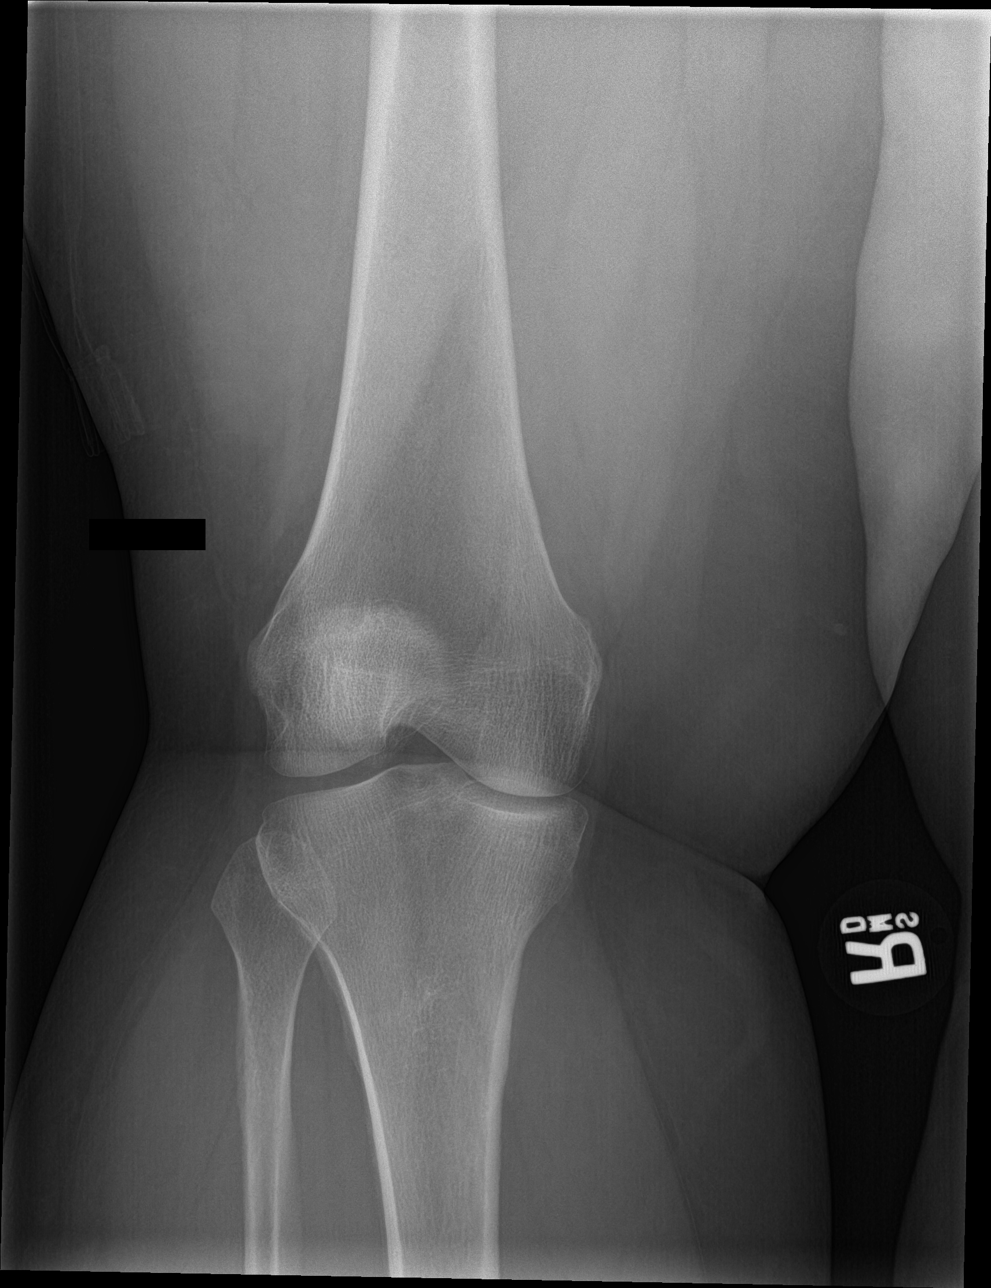

[patella skyline]
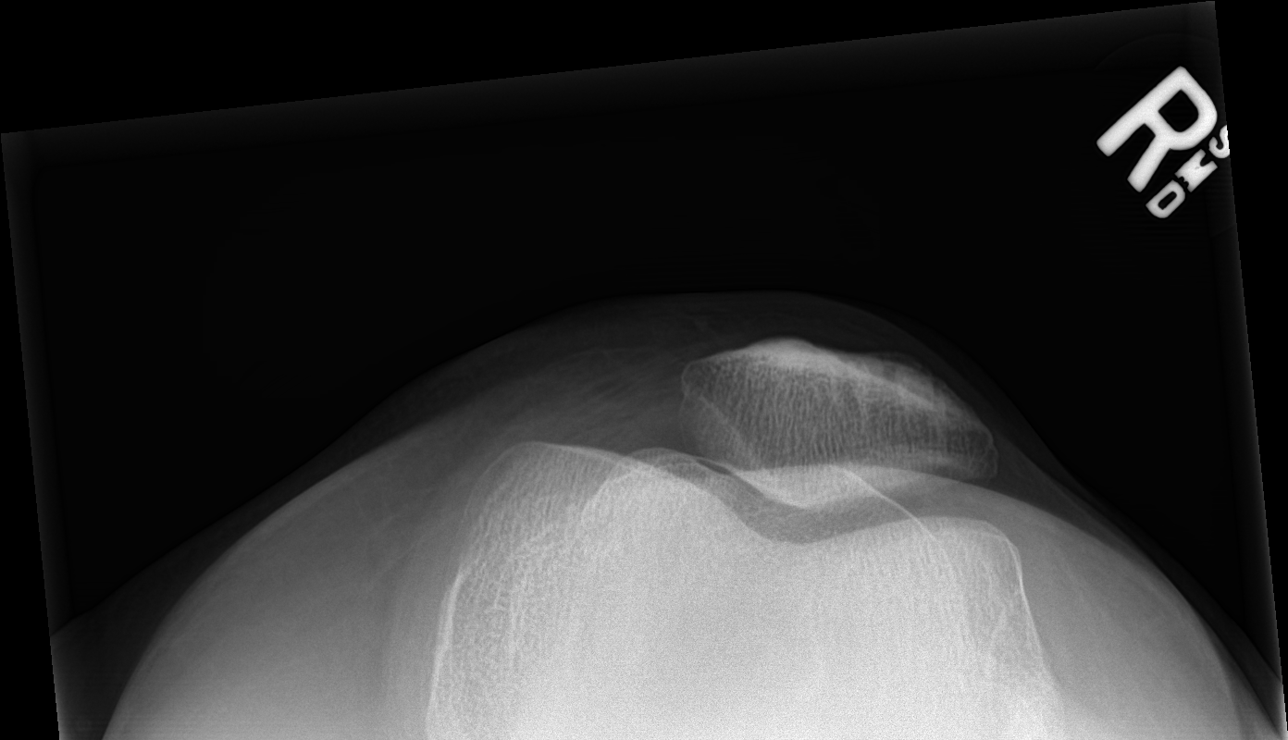

[4 of 4 positions shown; findings below may reference images not displayed]

FINDINGS: No evidence of fracture, dislocation, or joint effusion. No evidence
of arthropathy or other focal bone abnormality. Soft tissues are
unremarkable.
IMPRESSION: Negative.
